# Patient Record
Sex: Female | Born: 1990 | Race: White | Hispanic: No | Marital: Single | Smoking: Never smoker
Health system: Southern US, Community
[De-identification: ages and names within clinical notes are randomized; demographics above are authoritative.]

## PROBLEM LIST (undated history)

## (undated) DIAGNOSIS — F329 Major depressive disorder, single episode, unspecified: Secondary | ICD-10-CM

## (undated) DIAGNOSIS — F32A Depression, unspecified: Secondary | ICD-10-CM

---

## 2013-01-06 ENCOUNTER — Emergency Department (HOSPITAL_COMMUNITY)
Admission: EM | Admit: 2013-01-06 | Discharge: 2013-01-06 | Disposition: A | Discharge status: Home / Self Care | Payer: BC Managed Care – PPO | Attending: Emergency Medicine | Admitting: Emergency Medicine

## 2013-01-06 ENCOUNTER — Encounter (HOSPITAL_COMMUNITY): Payer: Self-pay | Admitting: Emergency Medicine

## 2013-01-06 ENCOUNTER — Emergency Department (HOSPITAL_COMMUNITY): Payer: BC Managed Care – PPO

## 2013-01-06 DIAGNOSIS — Z79899 Other long term (current) drug therapy: Secondary | ICD-10-CM | POA: Insufficient documentation

## 2013-01-06 DIAGNOSIS — S0990XA Unspecified injury of head, initial encounter: Secondary | ICD-10-CM

## 2013-01-06 DIAGNOSIS — W1809XA Striking against other object with subsequent fall, initial encounter: Secondary | ICD-10-CM | POA: Insufficient documentation

## 2013-01-06 DIAGNOSIS — F3289 Other specified depressive episodes: Secondary | ICD-10-CM | POA: Insufficient documentation

## 2013-01-06 DIAGNOSIS — F329 Major depressive disorder, single episode, unspecified: Secondary | ICD-10-CM | POA: Insufficient documentation

## 2013-01-06 DIAGNOSIS — R110 Nausea: Secondary | ICD-10-CM | POA: Insufficient documentation

## 2013-01-06 DIAGNOSIS — Y9341 Activity, dancing: Secondary | ICD-10-CM | POA: Insufficient documentation

## 2013-01-06 DIAGNOSIS — Y9289 Other specified places as the place of occurrence of the external cause: Secondary | ICD-10-CM | POA: Insufficient documentation

## 2013-01-06 HISTORY — DX: Major depressive disorder, single episode, unspecified: F32.9

## 2013-01-06 HISTORY — DX: Depression, unspecified: F32.A

## 2013-01-06 NOTE — ED Notes (Signed)
Pt presenting to ed with c/o falling while in dance class and hitting the back of her head. Pt states she hit the back of her head and denies loc. Pt states intermittent nausea no vomiting. Pt states onset this am at 10:15

## 2013-01-06 NOTE — ED Provider Notes (Signed)
History     CSN: [id number]  Arrival date & time 01/06/13  1347   First MD Initiated Contact with Patient 01/06/13 1352      Chief Complaint  Patient presents with  . Fall  . Headache    (Consider location/radiation/quality/duration/timing/severity/associated sxs/prior treatment) HPI Comments: Patient presents emergency department with chief complaint of headache. Patient states that she was in dance class today, and was doing "floor work," when one of the maneuvers required that she roll onto her back. When she was doing this, she hit her head against the floor. She states that this happened twice. States after dye was class she had a headache which has progressively worsened. She also states that she has felt mildly nauseated. She denies any vomiting. She denies any visual deficits. Denies any weakness, numbness or tingling of the arms or legs. She denies any other symptoms except for the headache and mild nausea.  The history is provided by the patient. No language interpreter was used.    Past Medical History  Diagnosis Date  . Depression     History reviewed. No pertinent past surgical history.  No family history on file.  History  Substance Use Topics  . Smoking status: Never Smoker   . Smokeless tobacco: Not on file  . Alcohol Use: No    OB History   Grav Para Term Preterm Abortions TAB SAB Ect Mult Living                  Review of Systems  All other systems reviewed and are negative.    Allergies  Review of patient's allergies indicates no known allergies.  Home Medications   Current Outpatient Rx  Name  Route  Sig  Dispense  Refill  . escitalopram (LEXAPRO) 20 MG tablet   Oral   Take 20 mg by mouth daily.         . Multiple Vitamin (MULTIVITAMIN WITH MINERALS) TABS   Oral   Take 1 tablet by mouth daily.           BP 136/58  Pulse 81  Temp(Src) 99 F (37.2 C) (Oral)  Resp 18  SpO2 100%  LMP 12/23/2012  Physical Exam  Nursing note  and vitals reviewed. Constitutional: She is oriented to person, place, and time. She appears well-developed and well-nourished.  HENT:  Head: Normocephalic and atraumatic.  Right Ear: External ear normal.  Left Ear: External ear normal.  Non-tender over temporal artery, no increased pain with chewing.  Eyes: Conjunctivae and EOM are normal. Pupils are equal, round, and reactive to light.  No papilledema  Neck: Normal range of motion. Neck supple.  No pain with neck flexion, no meningismus  Cardiovascular: Normal rate, regular rhythm and normal heart sounds.  Exam reveals no gallop and no friction rub.   No murmur heard. Pulmonary/Chest: Effort normal and breath sounds normal. No respiratory distress. She has no wheezes. She has no rales. She exhibits no tenderness.  Abdominal: Soft. Bowel sounds are normal. She exhibits no distension and no mass. There is no tenderness. There is no rebound and no guarding.  Musculoskeletal: Normal range of motion. She exhibits no edema and no tenderness.  Normal gait.  Neurological: She is alert and oriented to person, place, and time.  CN 3-12 intact, no pronator drift, normal shin to heel, normal RAM, sensation and strength intact bilaterally.  Skin: Skin is warm and dry.  Psychiatric: She has a normal mood and affect. Her behavior is normal.  Judgment and thought content normal.    ED Course  Procedures (including critical care time)  No results found for this or any previous visit. Ct Head Wo Contrast  01/06/2013  *RADIOLOGY REPORT*  Clinical Data: Larey Seat hitting head, headache  CT HEAD WITHOUT CONTRAST  Technique:  Contiguous axial images were obtained from the base of the skull through the vertex without contrast.  Comparison: None.  Findings: The ventricular system is normal in size and configuration, and the septum is in a normal midline position.  The fourth ventricle and basilar cisterns appear normal.  No hemorrhage, mass lesion, or acute  infarction is seen.  On bone window images, no calvarial abnormality is noted.  No scalp hematoma is evident.  The paranasal sinuses are pneumatized.  IMPRESSION: Negative unenhanced CT of the brain.   Original Report Authenticated By: Dwyane Dee, M.D.       1. Head injury, initial encounter       MDM  Patient with headache following hitting her head against the dance floor. Believe this to be low impact injury. She currently clears [place] head CT rules. Plan is to discharge with return precautions.  Patient states that she feels increasing nausea. I will order a head CT.  The head CT is negative, will discharge the patient to home with instructions to rest.        Roxy Horseman, PA-C 01/06/13 1521

## 2013-01-08 NOTE — ED Provider Notes (Signed)
Medical screening examination/treatment/procedure(s) were performed by non-physician practitioner and as supervising physician I was immediately available for consultation/collaboration.   Kristl Morioka, MD 01/08/13 0027 

## 2014-04-09 IMAGING — CT CT HEAD W/O CM
2 series · 17 of 30 positions shown, 20 images · non-contrast
Comparison: None.

CLINICAL DATA: Fell hitting head, headache

CT HEAD WITHOUT CONTRAST
TECHNIQUE: Contiguous axial images were obtained from the base of
the skull through the vertex without contrast.

[Series 2: head w/o · axial · non-contrast · 0.43mm/px · z∈[-308,-188]mm · 9 of 30 slices shown, 12 images]
[im 3/30  brain]
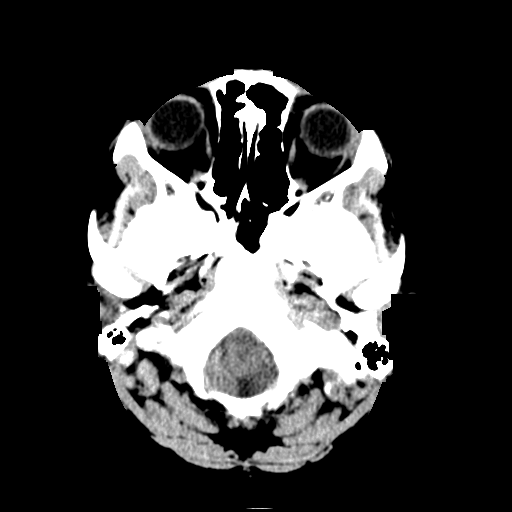
[im 3/30  bone]
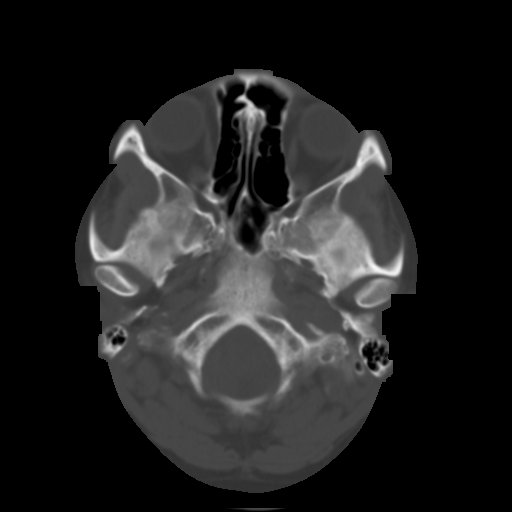
[im 6/30  brain]
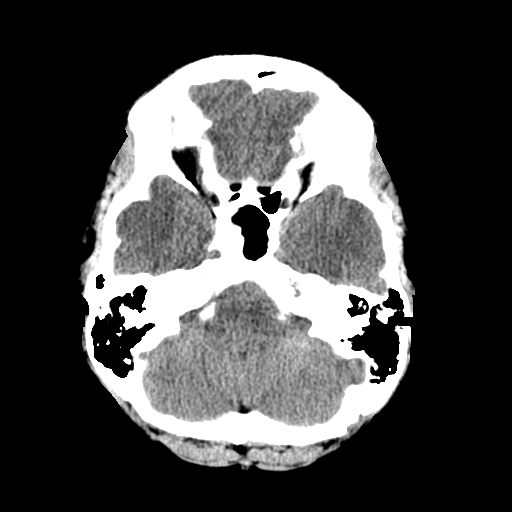
[im 9/30  brain]
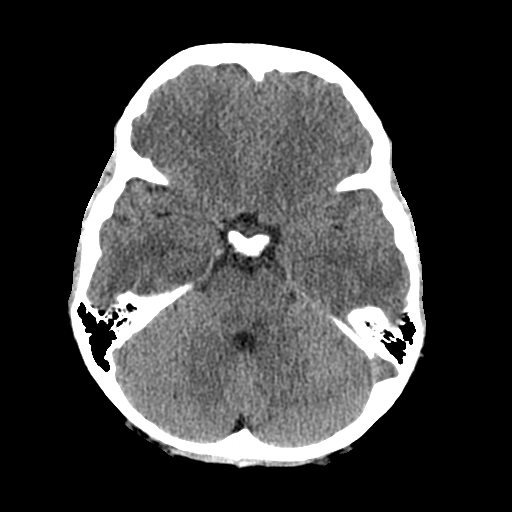
[im 12/30  brain]
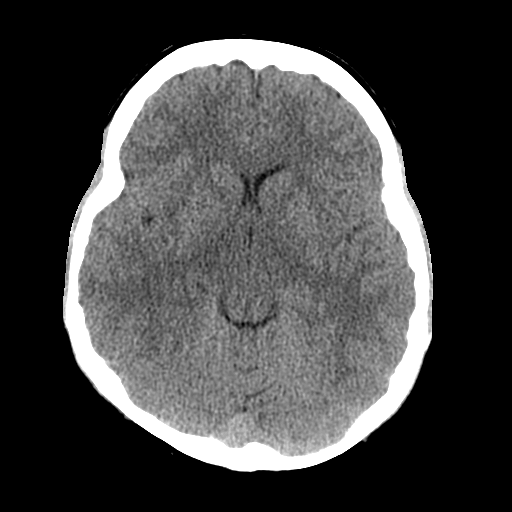
[im 15/30  brain]
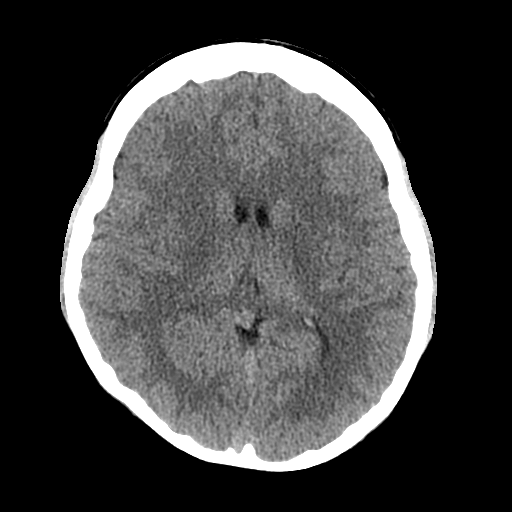
[im 15/30  bone]
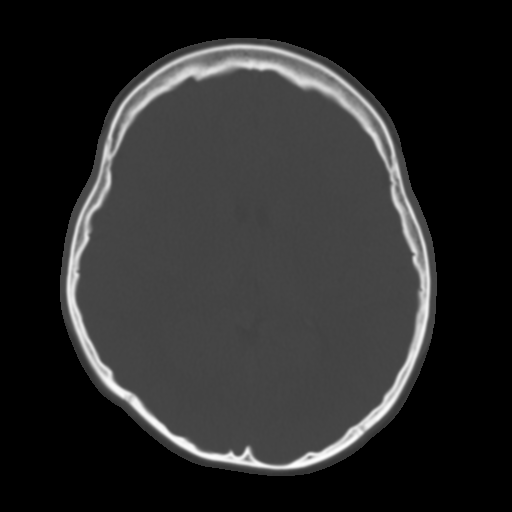
[im 18/30  brain]
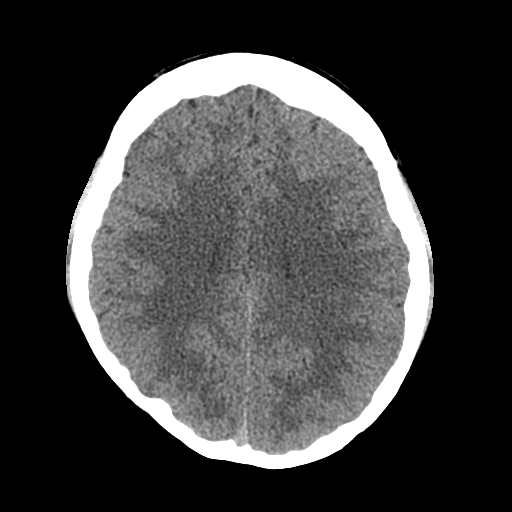
[im 21/30  brain]
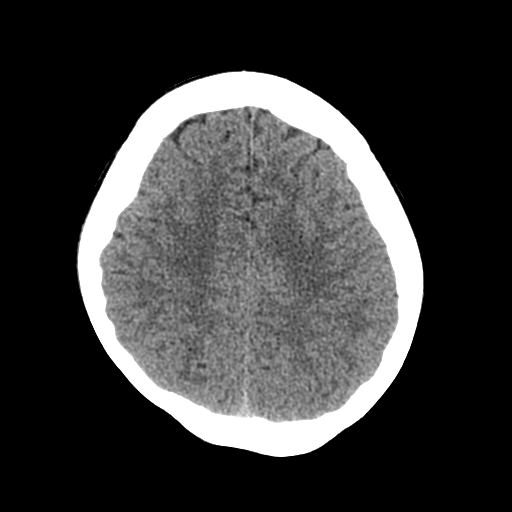
[im 24/30  brain]
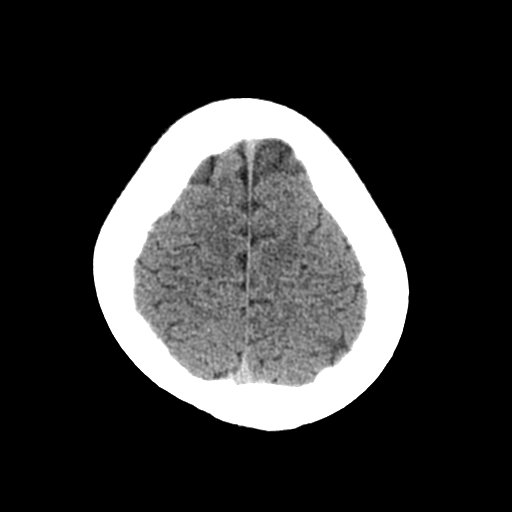
[im 27/30  brain]
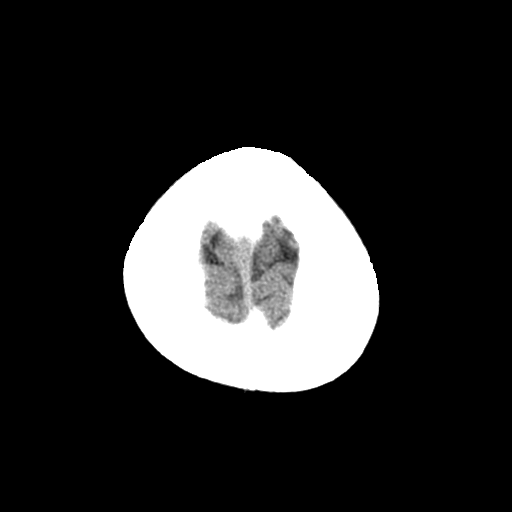
[im 27/30  bone]
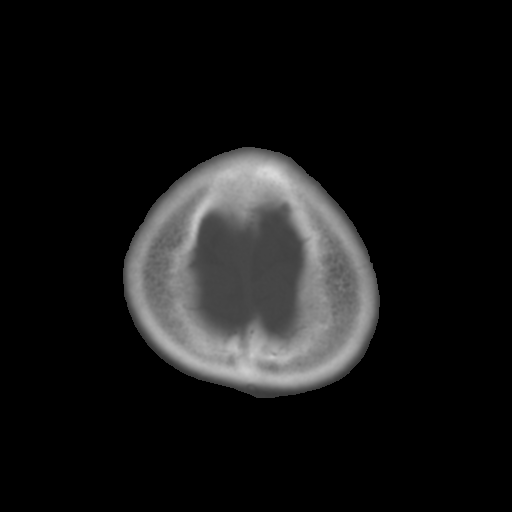

[Series 3: bone windows · axial · 0.43mm/px · z∈[-303,-189]mm · 8 of 50 slices shown]
[im 6/50  bone]
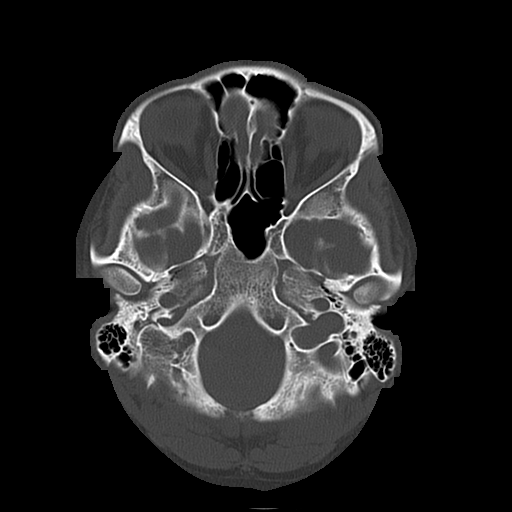
[im 11/50  bone]
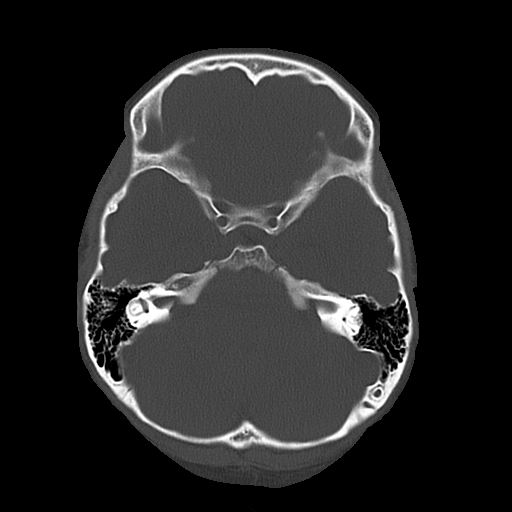
[im 17/50  bone]
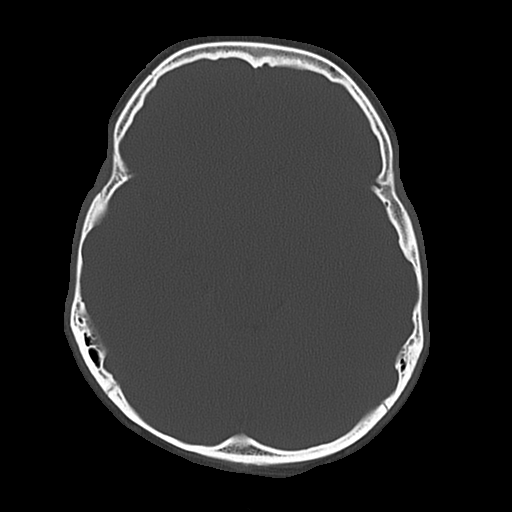
[im 22/50  bone]
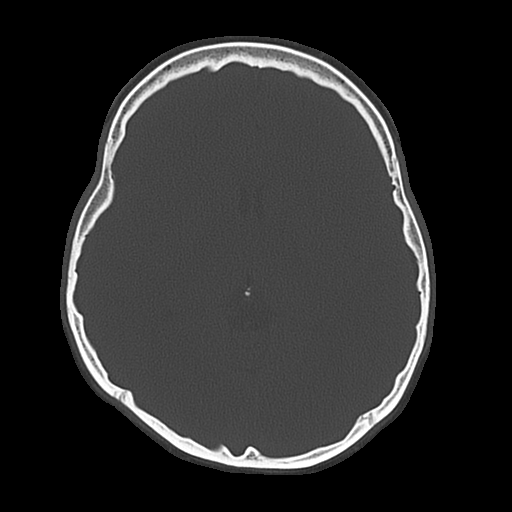
[im 28/50  bone]
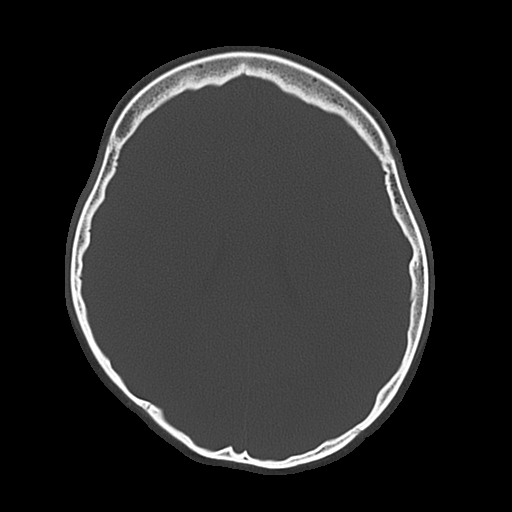
[im 33/50  bone]
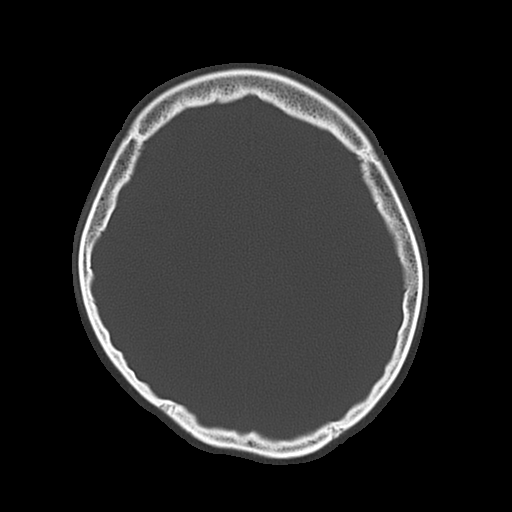
[im 39/50  bone]
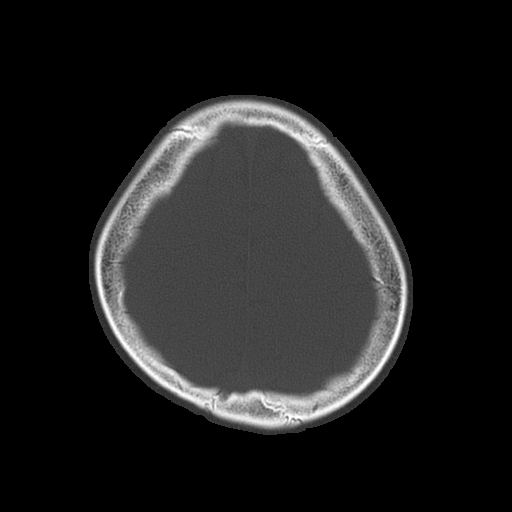
[im 44/50  bone]
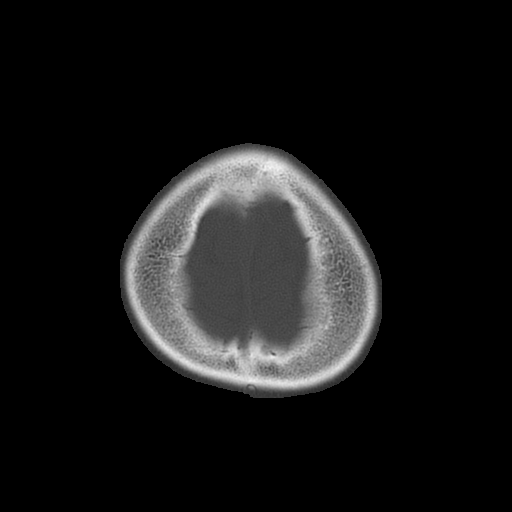

[17 of 30 positions shown; findings below may reference images not displayed]

FINDINGS: The ventricular system is normal in size and
configuration, and the septum is in a normal midline position.  The
fourth ventricle and basilar cisterns appear normal.  No
hemorrhage, mass lesion, or acute infarction is seen.  On bone
window images, no calvarial abnormality is noted.  No scalp
hematoma is evident.  The paranasal sinuses are pneumatized.
IMPRESSION: Negative unenhanced CT of the brain.

## 2014-09-03 ENCOUNTER — Encounter (HOSPITAL_COMMUNITY): Payer: Self-pay

## 2014-09-03 ENCOUNTER — Emergency Department (HOSPITAL_COMMUNITY)
Admission: EM | Admit: 2014-09-03 | Discharge: 2014-09-03 | Disposition: A | Discharge status: Home / Self Care | Payer: BC Managed Care – PPO | Source: Home / Self Care | Attending: Emergency Medicine | Admitting: Emergency Medicine

## 2014-09-03 DIAGNOSIS — J050 Acute obstructive laryngitis [croup]: Secondary | ICD-10-CM

## 2014-09-03 DIAGNOSIS — B9789 Other viral agents as the cause of diseases classified elsewhere: Secondary | ICD-10-CM

## 2014-09-03 MED ORDER — PREDNISONE 20 MG PO TABS: 40.0000 mg | ORAL_TABLET | Freq: Every day | ORAL | Status: DC

## 2014-09-03 MED ORDER — HYDROCODONE-HOMATROPINE 5-1.5 MG/5ML PO SYRP: 5.0000 mL | ORAL_SOLUTION | Freq: Four times a day (QID) | ORAL | Status: DC | PRN

## 2014-09-03 NOTE — ED Notes (Signed)
C/o cough x couple of days, some improvement, but is starting to get worse . Minimal relief w OTC medications. Barking dry cough

## 2014-09-03 NOTE — Discharge Instructions (Signed)
You have a new viral infection causing the cough. Take prednisone 40mg  daily for 5 days. Use the cough syrup as needed.  This has a narcotic in it so do not take it if you are going to be driving.  If you develop fevers or trouble breathing, please come back.

## 2014-09-03 NOTE — ED Provider Notes (Signed)
CSN: [id number]     Arrival date & time 09/03/14  1008 History   First MD Initiated Contact with Patient 09/03/14 1037     Chief Complaint  Patient presents with  . Cough   (Consider location/radiation/quality/duration/timing/severity/associated sxs/prior Treatment) HPI She is a 23 year old woman here for evaluation of cough. She states that 3 weeks ago she was diagnosed with bronchitis and treated with antibiotics. She finished antibiotics 2 weeks ago, and states her cough had resolved at that time. Then, 3-4 days ago, she developed another cough. It is nonproductive. She reports feeling congestion in her chest. She has a mild runny nose. She had a low-grade temp on Thursday of 99.9. No nausea or vomiting. No sore throat or headache. No shortness of breath.  Past Medical History  Diagnosis Date  . Depression    History reviewed. No pertinent past surgical history. History reviewed. No pertinent family history. History  Substance Use Topics  . Smoking status: Never Smoker   . Smokeless tobacco: Not on file  . Alcohol Use: No   OB History    No data available     Review of Systems  Constitutional: Negative for fever and chills.  HENT: Positive for rhinorrhea. Negative for congestion, sore throat and trouble swallowing.   Respiratory: Positive for cough. Negative for chest tightness and shortness of breath.   Gastrointestinal: Negative for nausea and vomiting.  Neurological: Negative for headaches.    Allergies  Review of patient's allergies indicates no known allergies.  Home Medications   Prior to Admission medications   Medication Sig Start Date End Date Taking? Authorizing Provider  escitalopram (LEXAPRO) 20 MG tablet Take 20 mg by mouth daily.    Historical Provider, MD  HYDROcodone-homatropine (HYCODAN) 5-1.5 MG/5ML syrup Take 5 mLs by mouth every 6 (six) hours as needed for cough. 09/03/14   Charm RingsErin J Honig, MD  Multiple Vitamin (MULTIVITAMIN WITH MINERALS) TABS Take 1  tablet by mouth daily.    Historical Provider, MD  predniSONE (DELTASONE) 20 MG tablet Take 2 tablets (40 mg total) by mouth daily. 09/03/14   Charm RingsErin J Honig, MD   BP 137/75 mmHg  Pulse 101  Temp(Src) 98.9 F (37.2 C) (Oral)  Resp 18  SpO2 97% Physical Exam  Constitutional: She is oriented to person, place, and time. She appears well-developed and well-nourished.  HENT:  Nose: Nose normal.  Mouth/Throat: Oropharynx is clear and moist. No oropharyngeal exudate.  Neck: Neck supple.  Cardiovascular: Normal rate, regular rhythm and normal heart sounds.   No murmur heard. Pulmonary/Chest: Effort normal and breath sounds normal. No respiratory distress. She has no wheezes. She has no rales.  Lymphadenopathy:    She has no cervical adenopathy.  Neurological: She is alert and oriented to person, place, and time.    ED Course  Procedures (including critical care time) Labs Review Labs Reviewed - No data to display  Imaging Review No results found.   MDM   1. Viral croup    She has a croup sounding cough. We'll treat with prednisone for 5 days. Given cough syrup provided to use as needed. Discussed that this contains a narcotic medication. Follow-up as needed.    Charm RingsErin J Honig, MD 09/03/14 [phone]

## 2017-02-19 ENCOUNTER — Ambulatory Visit (INDEPENDENT_AMBULATORY_CARE_PROVIDER_SITE_OTHER): Payer: Managed Care, Other (non HMO) | Admitting: Clinical

## 2017-02-19 ENCOUNTER — Encounter (HOSPITAL_COMMUNITY): Payer: Self-pay | Admitting: Clinical

## 2017-02-19 DIAGNOSIS — F3162 Bipolar disorder, current episode mixed, moderate: Secondary | ICD-10-CM

## 2017-02-19 DIAGNOSIS — F4310 Post-traumatic stress disorder, unspecified: Secondary | ICD-10-CM | POA: Diagnosis not present
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
## 2017-03-05 ENCOUNTER — Ambulatory Visit (INDEPENDENT_AMBULATORY_CARE_PROVIDER_SITE_OTHER): Payer: Managed Care, Other (non HMO) | Admitting: Clinical

## 2017-03-05 ENCOUNTER — Encounter (HOSPITAL_COMMUNITY): Payer: Self-pay | Admitting: Clinical

## 2017-03-05 DIAGNOSIS — F3162 Bipolar disorder, current episode mixed, moderate: Secondary | ICD-10-CM

## 2017-03-05 DIAGNOSIS — F4310 Post-traumatic stress disorder, unspecified: Secondary | ICD-10-CM

## 2017-03-05 NOTE — Progress Notes (Signed)
   THERAPIST PROGRESS NOTE  Session Time: 9:00 -9:55   Participation Level: Active  Behavioral Response: CasualAlertDepressed  Type of Therapy: Individual Therapy  Treatment Goals addressed: Improve psychiatric symptoms, elevate mood, improve unhelpful thought patterns, emotional regulation skills,healthy coping skills  Interventions: Motivational interviewing, CBT,   Summary: Barbara Freeman is a 26 y.o. female who presents with Bipolar I Disorder, mixed,  moderate.   Suicidal/Homicidal: Nowithout intent/plan  Therapist Response: Barbara Freeman met with clinician for an individual therapy session. She discussed her psychiatric symptoms, her current life event and her goals for therapy. Barbara Freeman shared that she has been really struggling with her depression and is "mad at the world" Clinician asked open ended questions and Barbara Freeman shared about her psychiatric symptoms and her negative thoughts. Clinician asked open ended questions and Barbara Freeman shared about some events that she has been ruminating about. Client and clinician discussed cbt and the thought emotion connection. Clinician asked open ended questions and Barbara Freeman identified her negative thoughts and reaction to those thoughts. She then identified the evidence for and against the thoughts. She was then able to formulate healthier alternative thoughts which are more in alignment with her personal goal of healthy relationships.Clinician gave her bipolar packet 1 &2 and some ABC CBT worksheets. Clinician informed Barbara Freeman she would be leaving the practice. Clinician asked open ended questions and Barbara Freeman shared her thoughts and emotions about clinician leaving. Client and clinician discussed options for her to continue therapy .   Plan: Return again in 1-2 weeks.  Diagnosis: Axis I: Bipolar I Disorder, mixed,  moderate.       Shyteria Lewis A, LCSW 03/05/2017

## 2017-06-24 ENCOUNTER — Ambulatory Visit: Payer: Managed Care, Other (non HMO) | Admitting: Neurology

## 2018-09-30 DIAGNOSIS — F4310 Post-traumatic stress disorder, unspecified: Secondary | ICD-10-CM | POA: Diagnosis not present

## 2018-10-01 DIAGNOSIS — M79671 Pain in right foot: Secondary | ICD-10-CM | POA: Diagnosis not present

## 2018-10-01 DIAGNOSIS — S93601A Unspecified sprain of right foot, initial encounter: Secondary | ICD-10-CM | POA: Diagnosis not present

## 2018-10-01 DIAGNOSIS — W108XXA Fall (on) (from) other stairs and steps, initial encounter: Secondary | ICD-10-CM | POA: Diagnosis not present

## 2018-10-05 DIAGNOSIS — F4310 Post-traumatic stress disorder, unspecified: Secondary | ICD-10-CM | POA: Diagnosis not present

## 2018-10-07 DIAGNOSIS — M25551 Pain in right hip: Secondary | ICD-10-CM | POA: Diagnosis not present

## 2018-10-12 DIAGNOSIS — F4320 Adjustment disorder, unspecified: Secondary | ICD-10-CM | POA: Diagnosis not present

## 2018-10-14 DIAGNOSIS — F330 Major depressive disorder, recurrent, mild: Secondary | ICD-10-CM | POA: Diagnosis not present

## 2018-10-14 DIAGNOSIS — F603 Borderline personality disorder: Secondary | ICD-10-CM | POA: Diagnosis not present

## 2018-10-14 DIAGNOSIS — F411 Generalized anxiety disorder: Secondary | ICD-10-CM | POA: Diagnosis not present

## 2018-10-19 DIAGNOSIS — F3341 Major depressive disorder, recurrent, in partial remission: Secondary | ICD-10-CM | POA: Diagnosis not present

## 2018-10-19 DIAGNOSIS — M25651 Stiffness of right hip, not elsewhere classified: Secondary | ICD-10-CM | POA: Diagnosis not present

## 2018-10-19 DIAGNOSIS — F3181 Bipolar II disorder: Secondary | ICD-10-CM | POA: Diagnosis not present

## 2018-10-19 DIAGNOSIS — F3281 Premenstrual dysphoric disorder: Secondary | ICD-10-CM | POA: Diagnosis not present

## 2018-10-19 DIAGNOSIS — M6289 Other specified disorders of muscle: Secondary | ICD-10-CM | POA: Diagnosis not present

## 2018-10-19 DIAGNOSIS — F4310 Post-traumatic stress disorder, unspecified: Secondary | ICD-10-CM | POA: Diagnosis not present

## 2018-10-19 DIAGNOSIS — M62830 Muscle spasm of back: Secondary | ICD-10-CM | POA: Diagnosis not present

## 2018-10-19 DIAGNOSIS — M62838 Other muscle spasm: Secondary | ICD-10-CM | POA: Diagnosis not present

## 2018-10-21 DIAGNOSIS — R42 Dizziness and giddiness: Secondary | ICD-10-CM | POA: Diagnosis not present

## 2018-10-21 DIAGNOSIS — D508 Other iron deficiency anemias: Secondary | ICD-10-CM | POA: Diagnosis not present

## 2018-10-21 DIAGNOSIS — F4310 Post-traumatic stress disorder, unspecified: Secondary | ICD-10-CM | POA: Diagnosis not present

## 2018-10-21 DIAGNOSIS — N925 Other specified irregular menstruation: Secondary | ICD-10-CM | POA: Diagnosis not present

## 2018-10-26 DIAGNOSIS — F4310 Post-traumatic stress disorder, unspecified: Secondary | ICD-10-CM | POA: Diagnosis not present

## 2018-10-28 DIAGNOSIS — M6289 Other specified disorders of muscle: Secondary | ICD-10-CM | POA: Diagnosis not present

## 2018-10-28 DIAGNOSIS — M62830 Muscle spasm of back: Secondary | ICD-10-CM | POA: Diagnosis not present

## 2018-10-28 DIAGNOSIS — M62838 Other muscle spasm: Secondary | ICD-10-CM | POA: Diagnosis not present

## 2018-10-28 DIAGNOSIS — M25651 Stiffness of right hip, not elsewhere classified: Secondary | ICD-10-CM | POA: Diagnosis not present

## 2018-11-02 DIAGNOSIS — M62830 Muscle spasm of back: Secondary | ICD-10-CM | POA: Diagnosis not present

## 2018-11-02 DIAGNOSIS — M25651 Stiffness of right hip, not elsewhere classified: Secondary | ICD-10-CM | POA: Diagnosis not present

## 2018-11-02 DIAGNOSIS — M62838 Other muscle spasm: Secondary | ICD-10-CM | POA: Diagnosis not present

## 2018-11-02 DIAGNOSIS — M6289 Other specified disorders of muscle: Secondary | ICD-10-CM | POA: Diagnosis not present

## 2018-11-03 DIAGNOSIS — F4310 Post-traumatic stress disorder, unspecified: Secondary | ICD-10-CM | POA: Diagnosis not present

## 2018-11-09 DIAGNOSIS — M62830 Muscle spasm of back: Secondary | ICD-10-CM | POA: Diagnosis not present

## 2018-11-09 DIAGNOSIS — M62838 Other muscle spasm: Secondary | ICD-10-CM | POA: Diagnosis not present

## 2018-11-09 DIAGNOSIS — M25651 Stiffness of right hip, not elsewhere classified: Secondary | ICD-10-CM | POA: Diagnosis not present

## 2018-11-09 DIAGNOSIS — M6289 Other specified disorders of muscle: Secondary | ICD-10-CM | POA: Diagnosis not present

## 2018-11-11 DIAGNOSIS — F4310 Post-traumatic stress disorder, unspecified: Secondary | ICD-10-CM | POA: Diagnosis not present

## 2018-11-16 DIAGNOSIS — M62838 Other muscle spasm: Secondary | ICD-10-CM | POA: Diagnosis not present

## 2018-11-16 DIAGNOSIS — M25651 Stiffness of right hip, not elsewhere classified: Secondary | ICD-10-CM | POA: Diagnosis not present

## 2018-11-16 DIAGNOSIS — M6289 Other specified disorders of muscle: Secondary | ICD-10-CM | POA: Diagnosis not present

## 2018-11-16 DIAGNOSIS — M62830 Muscle spasm of back: Secondary | ICD-10-CM | POA: Diagnosis not present

## 2018-11-17 DIAGNOSIS — F4310 Post-traumatic stress disorder, unspecified: Secondary | ICD-10-CM | POA: Diagnosis not present

## 2018-11-22 DIAGNOSIS — H6122 Impacted cerumen, left ear: Secondary | ICD-10-CM | POA: Diagnosis not present

## 2018-11-23 DIAGNOSIS — M6289 Other specified disorders of muscle: Secondary | ICD-10-CM | POA: Diagnosis not present

## 2018-11-23 DIAGNOSIS — M62838 Other muscle spasm: Secondary | ICD-10-CM | POA: Diagnosis not present

## 2018-11-23 DIAGNOSIS — M25651 Stiffness of right hip, not elsewhere classified: Secondary | ICD-10-CM | POA: Diagnosis not present

## 2018-11-23 DIAGNOSIS — M62830 Muscle spasm of back: Secondary | ICD-10-CM | POA: Diagnosis not present

## 2018-11-25 DIAGNOSIS — F4310 Post-traumatic stress disorder, unspecified: Secondary | ICD-10-CM | POA: Diagnosis not present

## 2018-11-30 DIAGNOSIS — M25651 Stiffness of right hip, not elsewhere classified: Secondary | ICD-10-CM | POA: Diagnosis not present

## 2018-11-30 DIAGNOSIS — M6289 Other specified disorders of muscle: Secondary | ICD-10-CM | POA: Diagnosis not present

## 2018-11-30 DIAGNOSIS — M62830 Muscle spasm of back: Secondary | ICD-10-CM | POA: Diagnosis not present

## 2018-11-30 DIAGNOSIS — M62838 Other muscle spasm: Secondary | ICD-10-CM | POA: Diagnosis not present

## 2018-12-02 DIAGNOSIS — F3281 Premenstrual dysphoric disorder: Secondary | ICD-10-CM | POA: Diagnosis not present

## 2018-12-02 DIAGNOSIS — F3341 Major depressive disorder, recurrent, in partial remission: Secondary | ICD-10-CM | POA: Diagnosis not present

## 2018-12-02 DIAGNOSIS — F4310 Post-traumatic stress disorder, unspecified: Secondary | ICD-10-CM | POA: Diagnosis not present

## 2018-12-03 DIAGNOSIS — F4310 Post-traumatic stress disorder, unspecified: Secondary | ICD-10-CM | POA: Diagnosis not present

## 2018-12-07 DIAGNOSIS — M62838 Other muscle spasm: Secondary | ICD-10-CM | POA: Diagnosis not present

## 2018-12-07 DIAGNOSIS — M6289 Other specified disorders of muscle: Secondary | ICD-10-CM | POA: Diagnosis not present

## 2018-12-07 DIAGNOSIS — M25651 Stiffness of right hip, not elsewhere classified: Secondary | ICD-10-CM | POA: Diagnosis not present

## 2018-12-07 DIAGNOSIS — M62830 Muscle spasm of back: Secondary | ICD-10-CM | POA: Diagnosis not present

## 2018-12-13 DIAGNOSIS — F332 Major depressive disorder, recurrent severe without psychotic features: Secondary | ICD-10-CM | POA: Diagnosis not present

## 2018-12-14 DIAGNOSIS — M25651 Stiffness of right hip, not elsewhere classified: Secondary | ICD-10-CM | POA: Diagnosis not present

## 2018-12-14 DIAGNOSIS — M62838 Other muscle spasm: Secondary | ICD-10-CM | POA: Diagnosis not present

## 2018-12-14 DIAGNOSIS — M6289 Other specified disorders of muscle: Secondary | ICD-10-CM | POA: Diagnosis not present

## 2018-12-14 DIAGNOSIS — M62830 Muscle spasm of back: Secondary | ICD-10-CM | POA: Diagnosis not present

## 2018-12-15 DIAGNOSIS — F4310 Post-traumatic stress disorder, unspecified: Secondary | ICD-10-CM | POA: Diagnosis not present

## 2018-12-20 DIAGNOSIS — F3181 Bipolar II disorder: Secondary | ICD-10-CM | POA: Diagnosis not present

## 2018-12-21 DIAGNOSIS — M6289 Other specified disorders of muscle: Secondary | ICD-10-CM | POA: Diagnosis not present

## 2018-12-21 DIAGNOSIS — M62838 Other muscle spasm: Secondary | ICD-10-CM | POA: Diagnosis not present

## 2018-12-21 DIAGNOSIS — M25651 Stiffness of right hip, not elsewhere classified: Secondary | ICD-10-CM | POA: Diagnosis not present

## 2018-12-21 DIAGNOSIS — M62830 Muscle spasm of back: Secondary | ICD-10-CM | POA: Diagnosis not present

## 2018-12-25 DIAGNOSIS — F4310 Post-traumatic stress disorder, unspecified: Secondary | ICD-10-CM | POA: Diagnosis not present

## 2018-12-27 DIAGNOSIS — F4310 Post-traumatic stress disorder, unspecified: Secondary | ICD-10-CM | POA: Diagnosis not present

## 2018-12-30 DIAGNOSIS — F411 Generalized anxiety disorder: Secondary | ICD-10-CM | POA: Diagnosis not present

## 2019-01-03 DIAGNOSIS — F4310 Post-traumatic stress disorder, unspecified: Secondary | ICD-10-CM | POA: Diagnosis not present

## 2019-01-04 DIAGNOSIS — M62838 Other muscle spasm: Secondary | ICD-10-CM | POA: Diagnosis not present

## 2019-01-04 DIAGNOSIS — M62830 Muscle spasm of back: Secondary | ICD-10-CM | POA: Diagnosis not present

## 2019-01-04 DIAGNOSIS — M25651 Stiffness of right hip, not elsewhere classified: Secondary | ICD-10-CM | POA: Diagnosis not present

## 2019-01-04 DIAGNOSIS — M6289 Other specified disorders of muscle: Secondary | ICD-10-CM | POA: Diagnosis not present

## 2019-01-05 DIAGNOSIS — F4310 Post-traumatic stress disorder, unspecified: Secondary | ICD-10-CM | POA: Diagnosis not present

## 2019-01-05 DIAGNOSIS — F3341 Major depressive disorder, recurrent, in partial remission: Secondary | ICD-10-CM | POA: Diagnosis not present

## 2019-01-05 DIAGNOSIS — F3281 Premenstrual dysphoric disorder: Secondary | ICD-10-CM | POA: Diagnosis not present

## 2019-01-10 DIAGNOSIS — F332 Major depressive disorder, recurrent severe without psychotic features: Secondary | ICD-10-CM | POA: Diagnosis not present

## 2019-01-11 DIAGNOSIS — M25651 Stiffness of right hip, not elsewhere classified: Secondary | ICD-10-CM | POA: Diagnosis not present

## 2019-01-11 DIAGNOSIS — M6289 Other specified disorders of muscle: Secondary | ICD-10-CM | POA: Diagnosis not present

## 2019-01-11 DIAGNOSIS — M62838 Other muscle spasm: Secondary | ICD-10-CM | POA: Diagnosis not present

## 2019-01-11 DIAGNOSIS — M62830 Muscle spasm of back: Secondary | ICD-10-CM | POA: Diagnosis not present

## 2019-01-12 DIAGNOSIS — F4310 Post-traumatic stress disorder, unspecified: Secondary | ICD-10-CM | POA: Diagnosis not present

## 2019-01-17 DIAGNOSIS — F332 Major depressive disorder, recurrent severe without psychotic features: Secondary | ICD-10-CM | POA: Diagnosis not present

## 2019-01-18 DIAGNOSIS — M25651 Stiffness of right hip, not elsewhere classified: Secondary | ICD-10-CM | POA: Diagnosis not present

## 2019-01-18 DIAGNOSIS — M62830 Muscle spasm of back: Secondary | ICD-10-CM | POA: Diagnosis not present

## 2019-01-18 DIAGNOSIS — M6289 Other specified disorders of muscle: Secondary | ICD-10-CM | POA: Diagnosis not present

## 2019-01-18 DIAGNOSIS — M62838 Other muscle spasm: Secondary | ICD-10-CM | POA: Diagnosis not present

## 2019-01-22 DIAGNOSIS — F332 Major depressive disorder, recurrent severe without psychotic features: Secondary | ICD-10-CM | POA: Diagnosis not present

## 2019-01-24 DIAGNOSIS — F332 Major depressive disorder, recurrent severe without psychotic features: Secondary | ICD-10-CM | POA: Diagnosis not present

## 2019-01-25 DIAGNOSIS — M62830 Muscle spasm of back: Secondary | ICD-10-CM | POA: Diagnosis not present

## 2019-01-25 DIAGNOSIS — M25651 Stiffness of right hip, not elsewhere classified: Secondary | ICD-10-CM | POA: Diagnosis not present

## 2019-01-25 DIAGNOSIS — M62838 Other muscle spasm: Secondary | ICD-10-CM | POA: Diagnosis not present

## 2019-01-25 DIAGNOSIS — M6289 Other specified disorders of muscle: Secondary | ICD-10-CM | POA: Diagnosis not present

## 2019-01-28 DIAGNOSIS — F332 Major depressive disorder, recurrent severe without psychotic features: Secondary | ICD-10-CM | POA: Diagnosis not present

## 2019-02-01 DIAGNOSIS — Z131 Encounter for screening for diabetes mellitus: Secondary | ICD-10-CM | POA: Diagnosis not present

## 2019-02-01 DIAGNOSIS — Z0000 Encounter for general adult medical examination without abnormal findings: Secondary | ICD-10-CM | POA: Diagnosis not present

## 2019-02-01 DIAGNOSIS — M25651 Stiffness of right hip, not elsewhere classified: Secondary | ICD-10-CM | POA: Diagnosis not present

## 2019-02-01 DIAGNOSIS — M62830 Muscle spasm of back: Secondary | ICD-10-CM | POA: Diagnosis not present

## 2019-02-01 DIAGNOSIS — Z13220 Encounter for screening for lipoid disorders: Secondary | ICD-10-CM | POA: Diagnosis not present

## 2019-02-01 DIAGNOSIS — F3170 Bipolar disorder, currently in remission, most recent episode unspecified: Secondary | ICD-10-CM | POA: Diagnosis not present

## 2019-02-01 DIAGNOSIS — F332 Major depressive disorder, recurrent severe without psychotic features: Secondary | ICD-10-CM | POA: Diagnosis not present

## 2019-02-01 DIAGNOSIS — M62838 Other muscle spasm: Secondary | ICD-10-CM | POA: Diagnosis not present

## 2019-02-01 DIAGNOSIS — D649 Anemia, unspecified: Secondary | ICD-10-CM | POA: Diagnosis not present

## 2019-02-01 DIAGNOSIS — M6289 Other specified disorders of muscle: Secondary | ICD-10-CM | POA: Diagnosis not present

## 2019-02-04 DIAGNOSIS — F332 Major depressive disorder, recurrent severe without psychotic features: Secondary | ICD-10-CM | POA: Diagnosis not present

## 2019-02-07 DIAGNOSIS — F332 Major depressive disorder, recurrent severe without psychotic features: Secondary | ICD-10-CM | POA: Diagnosis not present

## 2019-02-08 DIAGNOSIS — D500 Iron deficiency anemia secondary to blood loss (chronic): Secondary | ICD-10-CM | POA: Diagnosis not present

## 2019-02-08 DIAGNOSIS — M62830 Muscle spasm of back: Secondary | ICD-10-CM | POA: Diagnosis not present

## 2019-02-08 DIAGNOSIS — M62838 Other muscle spasm: Secondary | ICD-10-CM | POA: Diagnosis not present

## 2019-02-08 DIAGNOSIS — N920 Excessive and frequent menstruation with regular cycle: Secondary | ICD-10-CM | POA: Diagnosis not present

## 2019-02-08 DIAGNOSIS — M25651 Stiffness of right hip, not elsewhere classified: Secondary | ICD-10-CM | POA: Diagnosis not present

## 2019-02-08 DIAGNOSIS — M6289 Other specified disorders of muscle: Secondary | ICD-10-CM | POA: Diagnosis not present

## 2019-02-14 DIAGNOSIS — F4310 Post-traumatic stress disorder, unspecified: Secondary | ICD-10-CM | POA: Diagnosis not present

## 2019-02-15 DIAGNOSIS — M62830 Muscle spasm of back: Secondary | ICD-10-CM | POA: Diagnosis not present

## 2019-02-15 DIAGNOSIS — M62838 Other muscle spasm: Secondary | ICD-10-CM | POA: Diagnosis not present

## 2019-02-15 DIAGNOSIS — F4310 Post-traumatic stress disorder, unspecified: Secondary | ICD-10-CM | POA: Diagnosis not present

## 2019-02-15 DIAGNOSIS — M25651 Stiffness of right hip, not elsewhere classified: Secondary | ICD-10-CM | POA: Diagnosis not present

## 2019-02-15 DIAGNOSIS — M6289 Other specified disorders of muscle: Secondary | ICD-10-CM | POA: Diagnosis not present

## 2019-02-16 DIAGNOSIS — N920 Excessive and frequent menstruation with regular cycle: Secondary | ICD-10-CM | POA: Diagnosis not present

## 2019-02-16 DIAGNOSIS — D500 Iron deficiency anemia secondary to blood loss (chronic): Secondary | ICD-10-CM | POA: Diagnosis not present

## 2019-02-18 DIAGNOSIS — F332 Major depressive disorder, recurrent severe without psychotic features: Secondary | ICD-10-CM | POA: Diagnosis not present

## 2019-02-23 DIAGNOSIS — D500 Iron deficiency anemia secondary to blood loss (chronic): Secondary | ICD-10-CM | POA: Diagnosis not present

## 2019-02-23 DIAGNOSIS — F332 Major depressive disorder, recurrent severe without psychotic features: Secondary | ICD-10-CM | POA: Diagnosis not present

## 2019-02-28 DIAGNOSIS — M62838 Other muscle spasm: Secondary | ICD-10-CM | POA: Diagnosis not present

## 2019-02-28 DIAGNOSIS — M6289 Other specified disorders of muscle: Secondary | ICD-10-CM | POA: Diagnosis not present

## 2019-02-28 DIAGNOSIS — M62830 Muscle spasm of back: Secondary | ICD-10-CM | POA: Diagnosis not present

## 2019-02-28 DIAGNOSIS — M25651 Stiffness of right hip, not elsewhere classified: Secondary | ICD-10-CM | POA: Diagnosis not present

## 2019-03-02 DIAGNOSIS — F4310 Post-traumatic stress disorder, unspecified: Secondary | ICD-10-CM | POA: Diagnosis not present

## 2019-03-07 DIAGNOSIS — J45909 Unspecified asthma, uncomplicated: Secondary | ICD-10-CM | POA: Diagnosis not present

## 2019-03-09 DIAGNOSIS — F4310 Post-traumatic stress disorder, unspecified: Secondary | ICD-10-CM | POA: Diagnosis not present

## 2019-03-09 DIAGNOSIS — D509 Iron deficiency anemia, unspecified: Secondary | ICD-10-CM | POA: Diagnosis not present

## 2019-03-09 DIAGNOSIS — Z01419 Encounter for gynecological examination (general) (routine) without abnormal findings: Secondary | ICD-10-CM | POA: Diagnosis not present

## 2019-03-09 DIAGNOSIS — Z1389 Encounter for screening for other disorder: Secondary | ICD-10-CM | POA: Diagnosis not present

## 2019-03-09 DIAGNOSIS — Z6839 Body mass index (BMI) 39.0-39.9, adult: Secondary | ICD-10-CM | POA: Diagnosis not present

## 2019-03-14 DIAGNOSIS — M62830 Muscle spasm of back: Secondary | ICD-10-CM | POA: Diagnosis not present

## 2019-03-14 DIAGNOSIS — M25651 Stiffness of right hip, not elsewhere classified: Secondary | ICD-10-CM | POA: Diagnosis not present

## 2019-03-14 DIAGNOSIS — M62838 Other muscle spasm: Secondary | ICD-10-CM | POA: Diagnosis not present

## 2019-03-14 DIAGNOSIS — M6289 Other specified disorders of muscle: Secondary | ICD-10-CM | POA: Diagnosis not present

## 2019-03-17 DIAGNOSIS — F4310 Post-traumatic stress disorder, unspecified: Secondary | ICD-10-CM | POA: Diagnosis not present

## 2019-03-24 DIAGNOSIS — F332 Major depressive disorder, recurrent severe without psychotic features: Secondary | ICD-10-CM | POA: Diagnosis not present

## 2019-03-28 DIAGNOSIS — M25651 Stiffness of right hip, not elsewhere classified: Secondary | ICD-10-CM | POA: Diagnosis not present

## 2019-03-28 DIAGNOSIS — M62830 Muscle spasm of back: Secondary | ICD-10-CM | POA: Diagnosis not present

## 2019-03-28 DIAGNOSIS — M6289 Other specified disorders of muscle: Secondary | ICD-10-CM | POA: Diagnosis not present

## 2019-03-28 DIAGNOSIS — M62838 Other muscle spasm: Secondary | ICD-10-CM | POA: Diagnosis not present

## 2019-03-31 DIAGNOSIS — F4310 Post-traumatic stress disorder, unspecified: Secondary | ICD-10-CM | POA: Diagnosis not present

## 2019-04-04 DIAGNOSIS — D500 Iron deficiency anemia secondary to blood loss (chronic): Secondary | ICD-10-CM | POA: Diagnosis not present

## 2019-04-04 DIAGNOSIS — R5383 Other fatigue: Secondary | ICD-10-CM | POA: Diagnosis not present

## 2019-04-04 DIAGNOSIS — D649 Anemia, unspecified: Secondary | ICD-10-CM | POA: Diagnosis not present

## 2019-04-07 DIAGNOSIS — F3341 Major depressive disorder, recurrent, in partial remission: Secondary | ICD-10-CM | POA: Diagnosis not present

## 2019-04-07 DIAGNOSIS — F4310 Post-traumatic stress disorder, unspecified: Secondary | ICD-10-CM | POA: Diagnosis not present

## 2019-04-07 DIAGNOSIS — F3281 Premenstrual dysphoric disorder: Secondary | ICD-10-CM | POA: Diagnosis not present

## 2019-04-11 DIAGNOSIS — M62830 Muscle spasm of back: Secondary | ICD-10-CM | POA: Diagnosis not present

## 2019-04-11 DIAGNOSIS — M6289 Other specified disorders of muscle: Secondary | ICD-10-CM | POA: Diagnosis not present

## 2019-04-11 DIAGNOSIS — M62838 Other muscle spasm: Secondary | ICD-10-CM | POA: Diagnosis not present

## 2019-04-11 DIAGNOSIS — M25651 Stiffness of right hip, not elsewhere classified: Secondary | ICD-10-CM | POA: Diagnosis not present

## 2019-04-12 DIAGNOSIS — F4310 Post-traumatic stress disorder, unspecified: Secondary | ICD-10-CM | POA: Diagnosis not present

## 2019-04-19 DIAGNOSIS — S81012A Laceration without foreign body, left knee, initial encounter: Secondary | ICD-10-CM | POA: Diagnosis not present

## 2019-04-21 DIAGNOSIS — S81012A Laceration without foreign body, left knee, initial encounter: Secondary | ICD-10-CM | POA: Diagnosis not present

## 2019-04-21 DIAGNOSIS — F4310 Post-traumatic stress disorder, unspecified: Secondary | ICD-10-CM | POA: Diagnosis not present

## 2019-04-25 DIAGNOSIS — M62838 Other muscle spasm: Secondary | ICD-10-CM | POA: Diagnosis not present

## 2019-04-25 DIAGNOSIS — M62830 Muscle spasm of back: Secondary | ICD-10-CM | POA: Diagnosis not present

## 2019-04-25 DIAGNOSIS — M25651 Stiffness of right hip, not elsewhere classified: Secondary | ICD-10-CM | POA: Diagnosis not present

## 2019-04-25 DIAGNOSIS — M6289 Other specified disorders of muscle: Secondary | ICD-10-CM | POA: Diagnosis not present

## 2019-04-28 DIAGNOSIS — F4310 Post-traumatic stress disorder, unspecified: Secondary | ICD-10-CM | POA: Diagnosis not present

## 2019-05-09 DIAGNOSIS — M25651 Stiffness of right hip, not elsewhere classified: Secondary | ICD-10-CM | POA: Diagnosis not present

## 2019-05-09 DIAGNOSIS — M62830 Muscle spasm of back: Secondary | ICD-10-CM | POA: Diagnosis not present

## 2019-05-09 DIAGNOSIS — M6289 Other specified disorders of muscle: Secondary | ICD-10-CM | POA: Diagnosis not present

## 2019-05-09 DIAGNOSIS — M62838 Other muscle spasm: Secondary | ICD-10-CM | POA: Diagnosis not present

## 2019-05-19 DIAGNOSIS — F4310 Post-traumatic stress disorder, unspecified: Secondary | ICD-10-CM | POA: Diagnosis not present

## 2019-05-26 DIAGNOSIS — F332 Major depressive disorder, recurrent severe without psychotic features: Secondary | ICD-10-CM | POA: Diagnosis not present

## 2019-05-31 DIAGNOSIS — F4310 Post-traumatic stress disorder, unspecified: Secondary | ICD-10-CM | POA: Diagnosis not present

## 2019-06-09 DIAGNOSIS — M2550 Pain in unspecified joint: Secondary | ICD-10-CM | POA: Diagnosis not present

## 2019-06-09 DIAGNOSIS — E559 Vitamin D deficiency, unspecified: Secondary | ICD-10-CM | POA: Diagnosis not present

## 2019-06-09 DIAGNOSIS — F4310 Post-traumatic stress disorder, unspecified: Secondary | ICD-10-CM | POA: Diagnosis not present

## 2019-06-09 DIAGNOSIS — R5383 Other fatigue: Secondary | ICD-10-CM | POA: Diagnosis not present

## 2019-06-10 DIAGNOSIS — M25651 Stiffness of right hip, not elsewhere classified: Secondary | ICD-10-CM | POA: Diagnosis not present

## 2019-06-10 DIAGNOSIS — M6289 Other specified disorders of muscle: Secondary | ICD-10-CM | POA: Diagnosis not present

## 2019-06-10 DIAGNOSIS — M62830 Muscle spasm of back: Secondary | ICD-10-CM | POA: Diagnosis not present

## 2019-06-10 DIAGNOSIS — M62838 Other muscle spasm: Secondary | ICD-10-CM | POA: Diagnosis not present

## 2019-06-13 DIAGNOSIS — M7989 Other specified soft tissue disorders: Secondary | ICD-10-CM | POA: Diagnosis not present

## 2019-06-13 DIAGNOSIS — M2550 Pain in unspecified joint: Secondary | ICD-10-CM | POA: Diagnosis not present

## 2019-06-16 DIAGNOSIS — F4310 Post-traumatic stress disorder, unspecified: Secondary | ICD-10-CM | POA: Diagnosis not present

## 2019-06-23 DIAGNOSIS — F4310 Post-traumatic stress disorder, unspecified: Secondary | ICD-10-CM | POA: Diagnosis not present

## 2019-06-24 DIAGNOSIS — M25651 Stiffness of right hip, not elsewhere classified: Secondary | ICD-10-CM | POA: Diagnosis not present

## 2019-06-24 DIAGNOSIS — M62830 Muscle spasm of back: Secondary | ICD-10-CM | POA: Diagnosis not present

## 2019-06-24 DIAGNOSIS — M62838 Other muscle spasm: Secondary | ICD-10-CM | POA: Diagnosis not present

## 2019-06-24 DIAGNOSIS — M6289 Other specified disorders of muscle: Secondary | ICD-10-CM | POA: Diagnosis not present

## 2019-06-29 ENCOUNTER — Ambulatory Visit: Payer: Managed Care, Other (non HMO) | Admitting: Allergy

## 2019-07-04 DIAGNOSIS — F4310 Post-traumatic stress disorder, unspecified: Secondary | ICD-10-CM | POA: Diagnosis not present

## 2019-07-14 DIAGNOSIS — F4310 Post-traumatic stress disorder, unspecified: Secondary | ICD-10-CM | POA: Diagnosis not present

## 2019-07-18 ENCOUNTER — Ambulatory Visit: Payer: Managed Care, Other (non HMO) | Admitting: Allergy

## 2019-08-08 ENCOUNTER — Ambulatory Visit: Payer: Self-pay | Admitting: Allergy

## 2019-08-08 NOTE — Progress Notes (Deleted)
New Patient Note  RE: Barbara Freeman MRN: [id number] DOB: August 31, 1991 Date of Office Visit: 08/08/2019  Referring provider: Lucila Maine Primary care provider: Teena Irani, PA-C  Chief Complaint: No chief complaint on file.  History of Present Illness: I had the pleasure of seeing Barbara Freeman for initial evaluation at the [facility] on 08/08/2019. She is a 28 y.o. female, who is referred here by Teena Irani, PA-C for the evaluation of food allergies. She is accompanied today by her *** who provided/contributed to the history.   Food: She reports food allergy to ***. The reaction occurred at the age of ***, after she ate *** amount of ***. Symptoms started within *** and was in the form of *** hives, swelling, wheezing, abdominal pain, diarrhea, vomiting. ***Denies any associated cofactors such as exertion, infection, NSAID use, or alcohol consumption. The symptoms lasted for ***. She was evaluated in ED and received ***. Since this episode, she does *** not report other accidental exposures to ***. She does *** not have access to epinephrine autoinjector and *** needed to use it.   Past work up includes: immunocap which showed *** and skin prick testing which showed ***.  Dietary History: patient has been eating other foods including ***milk, ***eggs, ***peanut, ***treenuts, ***sesame, ***shellfish, ***seafood, ***soy, ***wheat, ***meats, ***fruits and ***vegetables.  She reports reading labels and avoiding *** in diet completely. She tolerates ***baked egg and baked milk products.   Assessment and Plan: [name] is a 28 y.o. female with: No problem-specific Assessment & Plan notes found for this encounter.  No follow-ups on file.  No orders of the defined types were placed in this encounter.  Lab Orders  No laboratory test(s) ordered today    Other allergy screening: Asthma: {Blank single:19197::"yes","no"} Rhino conjunctivitis: {Blank  single:19197::"yes","no"} Food allergy: {Blank single:19197::"yes","no"} Medication allergy: {Blank single:19197::"yes","no"} Hymenoptera allergy: {Blank single:19197::"yes","no"} Urticaria: {Blank single:19197::"yes","no"} Eczema:{Blank single:19197::"yes","no"} History of recurrent infections suggestive of immunodeficency: {Blank single:19197::"yes","no"}  Diagnostics: Spirometry:  Tracings reviewed. Her effort: {Blank single:19197::"Good reproducible efforts.","It was hard to get consistent efforts and there is a question as to whether this reflects a maximal maneuver.","Poor effort, data can not be interpreted."} FVC: ***L FEV1: ***L, ***% predicted FEV1/FVC ratio: ***% Interpretation: {Blank single:19197::"Spirometry consistent with mild obstructive disease","Spirometry consistent with moderate obstructive disease","Spirometry consistent with severe obstructive disease","Spirometry consistent with possible restrictive disease","Spirometry consistent with mixed obstructive and restrictive disease","Spirometry uninterpretable due to technique","Spirometry consistent with normal pattern","No overt abnormalities noted given today's efforts"}.  Please see scanned spirometry results for details.  Skin Testing: {Blank single:19197::"Select foods","Environmental allergy panel","Environmental allergy panel and select foods","Food allergy panel","None","Deferred due to recent antihistamines use"}. Positive test to: ***. Negative test to: ***.  Results discussed with patient/family.   Past Medical History: There are no active problems to display for this patient.  Past Medical History:  Diagnosis Date  . Depression    Past Surgical History: No past surgical history on file. Medication List:  Current Outpatient Medications  Medication Sig Dispense Refill  . DULoxetine (CYMBALTA) 20 MG capsule Take 20 mg by mouth 2 (two) times daily.    Marland Kitchen escitalopram (LEXAPRO) 20 MG tablet Take 20 mg by  mouth daily.    Marland Kitchen HYDROcodone-homatropine (HYCODAN) 5-1.5 MG/5ML syrup Take 5 mLs by mouth every 6 (six) hours as needed for cough. (Patient not taking: Reported on 02/19/2017) 120 mL 0  . Multiple Vitamin (MULTIVITAMIN WITH MINERALS) TABS Take 1 tablet by mouth daily.    Marland Kitchen  OXcarbazepine (TRILEPTAL) 150 MG tablet Take 150 mg by mouth 2 (two) times daily.    . predniSONE (DELTASONE) 20 MG tablet Take 2 tablets (40 mg total) by mouth daily. (Patient not taking: Reported on 02/19/2017) 10 tablet 0  . QUEtiapine (SEROQUEL) 300 MG tablet Take 300 mg by mouth at bedtime.     No current facility-administered medications for this visit.    Allergies: No Known Allergies Social History: Social History   Socioeconomic History  . Marital status: Single    Spouse name: Not on file  . Number of children: Not on file  . Years of education: Not on file  . Highest education level: Not on file  Occupational History  . Not on file  Social Needs  . Financial resource strain: Not on file  . Food insecurity    Worry: Not on file    Inability: Not on file  . Transportation needs    Medical: Not on file    Non-medical: Not on file  Tobacco Use  . Smoking status: Never Smoker  . Smokeless tobacco: Never Used  Substance and Sexual Activity  . Alcohol use: Yes    Alcohol/week: 1.0 standard drinks    Types: 1 Glasses of wine per week  . Drug use: No  . Sexual activity: Not Currently    Partners: Male  Lifestyle  . Physical activity    Days per week: Not on file    Minutes per session: Not on file  . Stress: Not on file  Relationships  . Social Musicianconnections    Talks on phone: Not on file    Gets together: Not on file    Attends religious service: Not on file    Active member of club or organization: Not on file    Attends meetings of clubs or organizations: Not on file    Relationship status: Not on file  Other Topics Concern  . Not on file  Social History Narrative  . Not on file   Lives  in a ***. Smoking: *** Occupation: ***  Environmental HistorySurveyor, minerals: Water Damage/mildew in the house: Copywriter, advertising{Blank single:19197::"yes","no"} Carpet in the family room: {Blank single:19197::"yes","no"} Carpet in the bedroom: {Blank single:19197::"yes","no"} Heating: {Blank single:19197::"electric","gas"} Cooling: {Blank single:19197::"central","window"} Pet: {Blank single:19197::"yes ***","no"}  Family History: No family history on file. Problem                               Relation Asthma                                   *** Eczema                                *** Food allergy                          *** Allergic rhino conjunctivitis     ***  Review of Systems  Constitutional: Negative for appetite change, chills, fever and unexpected weight change.  HENT: Negative for congestion and rhinorrhea.   Eyes: Negative for itching.  Respiratory: Negative for cough, chest tightness, shortness of breath and wheezing.   Cardiovascular: Negative for chest pain.  Gastrointestinal: Negative for abdominal pain.  Genitourinary: Negative for difficulty urinating.  Skin: Negative for rash.  Allergic/Immunologic: Negative  for environmental allergies and food allergies.  Neurological: Negative for headaches.   Objective: There were no vitals taken for this visit. There is no height or weight on file to calculate BMI. Physical Exam  Constitutional: She is oriented to person, place, and time. She appears well-developed and well-nourished.  HENT:  Head: Normocephalic and atraumatic.  Right Ear: External ear normal.  Left Ear: External ear normal.  Nose: Nose normal.  Mouth/Throat: Oropharynx is clear and moist.  Eyes: Conjunctivae and EOM are normal.  Neck: Neck supple.  Cardiovascular: Normal rate, regular rhythm and normal heart sounds. Exam reveals no gallop and no friction rub.  No murmur heard. Pulmonary/Chest: Effort normal and breath sounds normal. She has no wheezes. She has no rales.   Abdominal: Soft.  Neurological: She is alert and oriented to person, place, and time.  Skin: Skin is warm. No rash noted.  Psychiatric: She has a normal mood and affect. Her behavior is normal.  Nursing note and vitals reviewed.  The plan was reviewed with the patient/family, and all questions/concerned were addressed.  It was my pleasure to see [place] today and participate in her care. Please feel free to contact me with any questions or concerns.  Sincerely,  Rexene Alberts, DO Allergy & Immunology  [facility] office: [phone] [facility] office: [place] office: [phone]

## 2020-01-30 ENCOUNTER — Encounter (HOSPITAL_COMMUNITY): Payer: Self-pay

## 2020-01-30 ENCOUNTER — Emergency Department (HOSPITAL_COMMUNITY)
Admission: EM | Admit: 2020-01-30 | Discharge: 2020-01-31 | Disposition: A | Discharge status: Home / Self Care | Payer: BC Managed Care – PPO | Attending: Emergency Medicine | Admitting: Emergency Medicine

## 2020-01-30 ENCOUNTER — Other Ambulatory Visit: Payer: Self-pay

## 2020-01-30 DIAGNOSIS — Y999 Unspecified external cause status: Secondary | ICD-10-CM | POA: Diagnosis not present

## 2020-01-30 DIAGNOSIS — Z79899 Other long term (current) drug therapy: Secondary | ICD-10-CM | POA: Insufficient documentation

## 2020-01-30 DIAGNOSIS — Y92007 Garden or yard of unspecified non-institutional (private) residence as the place of occurrence of the external cause: Secondary | ICD-10-CM | POA: Diagnosis not present

## 2020-01-30 DIAGNOSIS — Z23 Encounter for immunization: Secondary | ICD-10-CM | POA: Insufficient documentation

## 2020-01-30 DIAGNOSIS — S81812A Laceration without foreign body, left lower leg, initial encounter: Secondary | ICD-10-CM | POA: Insufficient documentation

## 2020-01-30 DIAGNOSIS — Y9339 Activity, other involving climbing, rappelling and jumping off: Secondary | ICD-10-CM | POA: Insufficient documentation

## 2020-01-30 DIAGNOSIS — W458XXA Other foreign body or object entering through skin, initial encounter: Secondary | ICD-10-CM | POA: Diagnosis not present

## 2020-01-30 MED ORDER — LIDOCAINE-EPINEPHRINE 2 %-1:100000 IJ SOLN
INTRAMUSCULAR | Status: AC
  Filled 2020-01-30: qty 1

## 2020-01-30 MED ORDER — LIDOCAINE-EPINEPHRINE 1 %-1:100000 IJ SOLN: 20.0000 mL | Freq: Once | INTRAMUSCULAR | Status: DC

## 2020-01-30 MED ORDER — LIDOCAINE-EPINEPHRINE (PF) 2 %-1:200000 IJ SOLN: 20.0000 mL | Freq: Once | INTRAMUSCULAR | Status: DC

## 2020-01-30 NOTE — ED Triage Notes (Signed)
Left leg laceration to left thigh after jumping over chain link fence. Very deep with underlying tissues showing.

## 2020-01-31 MED ORDER — BACITRACIN ZINC 500 UNIT/GM EX OINT
TOPICAL_OINTMENT | CUTANEOUS | Status: AC
  Filled 2020-01-31: qty 0.9

## 2020-01-31 MED ORDER — TETANUS-DIPHTH-ACELL PERTUSSIS 5-2.5-18.5 LF-MCG/0.5 IM SUSP
0.5000 mL | Freq: Once | INTRAMUSCULAR | Status: AC
  Administered 2020-01-31: 01:00:00 0.5 mL via INTRAMUSCULAR
  Filled 2020-01-31: qty 0.5

## 2020-01-31 MED ORDER — BACITRACIN ZINC 500 UNIT/GM EX OINT: TOPICAL_OINTMENT | Freq: Two times a day (BID) | CUTANEOUS | Status: DC

## 2020-01-31 NOTE — ED Notes (Signed)
Pt instructed to follow up for suture/ staple removal in 10-14 days.

## 2020-01-31 NOTE — ED Provider Notes (Signed)
Emergency Department Provider Note  I have reviewed the triage vital signs and the nursing notes.  HISTORY  Chief Complaint Leg Laceration   HPI Barbara Freeman is a 29 y.o. female without any significant past medical history who presents to the emergency department today secondary to a laceration.  Patient was jumping a fence in her backyard and got it caught on the chain link on the top and received a laceration to her left thigh.  No injuries elsewhere.  Did not fall or hit her head.  Last tetanus shot was approximately 8 years ago.  No known medical problems.   No other associated or modifying symptoms.    Past Medical History:  Diagnosis Date  . Depression     There are no problems to display for this patient.   History reviewed. No pertinent surgical history.  Current Outpatient Rx  . Order #: [id number] Class: Historical Med  . Order #: [id number] Class: Historical Med  . Order #: [id number] Class: Historical Med  . Order #: [id number] Class: Historical Med  . Order #: [id number] Class: Historical Med    Allergies Other  No family history on file.  Social History Social History   Tobacco Use  . Smoking status: Never Smoker  . Smokeless tobacco: Never Used  Substance Use Topics  . Alcohol use: Yes    Alcohol/week: 1.0 standard drinks    Types: 1 Glasses of wine per week  . Drug use: No    Review of Systems  All other systems negative except as documented in the HPI. All pertinent positives and negatives as reviewed in the HPI. ____________________________________________  PHYSICAL EXAM:  VITAL SIGNS: ED Triage Vitals  Enc Vitals Group     BP 01/30/20 2125 (!) 132/97     Pulse Rate 01/30/20 2125 (!) 116     Resp 01/30/20 2125 18     Temp 01/30/20 2125 98.3 F (36.8 C)     Temp Source 01/30/20 2125 Oral     SpO2 01/30/20 2125 97 %     Weight 01/30/20 2127 250 lb (113.4 kg)     Height 01/30/20 2127 5\' 6"  (1.676 m)    Constitutional: Alert and  oriented. Well appearing and in no acute distress. Eyes: Conjunctivae are normal. PERRL. EOMI. Head: Atraumatic. Nose: No congestion/rhinnorhea. Mouth/Throat: Mucous membranes are moist.  Oropharynx non-erythematous. Neck: No stridor.  No meningeal signs.   Cardiovascular: Normal rate, regular rhythm. Good peripheral circulation. Grossly normal heart sounds.   Respiratory: Normal respiratory effort.  No retractions. Lungs CTAB. Gastrointestinal: Soft and nontender. No distention.  Musculoskeletal: No lower extremity tenderness nor edema. No gross deformities of extremities. Neurologic:  Normal speech and language. No gross focal neurologic deficits are appreciated.  Skin:  Skin is warm, dry and intact. No rash noted. u-shaped laceration to left inner thigh with a middle flap that has some dusky skin on distal 0.5 cm. Hemostatic. Approximately 1.5 cm deep at deepest point, but mostly just in adipose tissue. Does not violate fascia or muscle. ____________________________________________   PROCEDURES  Procedure(s) performed:   Marland KitchenLaceration Repair  Date/Time: 01/31/2020 12:42 AM Performed by: 04/01/2020, MD Authorized by: Marily Memos, MD   Consent:    Consent obtained:  Verbal   Consent given by:  Patient   Risks discussed:  Infection, need for additional repair, nerve damage, poor wound healing, poor cosmetic result, pain, retained foreign body, tendon damage and vascular damage   Alternatives discussed:  No treatment, delayed treatment and observation Anesthesia (see MAR  for exact dosages):    Anesthesia method:  Local infiltration   Local anesthetic:  Lidocaine 1% WITH epi Laceration details:    Location:  Leg   Leg location:  L upper leg   Wound length (cm): u shaped, one leg was approximately 6 cm, other was 4cm, the bottom was approximately 2cm.   Depth (mm):  15 Repair type:    Repair type:  Intermediate Pre-procedure details:    Preparation:  Patient was prepped and  draped in usual sterile fashion and imaging obtained to evaluate for foreign bodies Exploration:    Wound exploration: wound explored through full range of motion and entire depth of wound probed and visualized     Wound extent: areolar tissue violated     Contaminated: no   Treatment:    Area cleansed with:  Saline   Amount of cleaning:  Extensive   Irrigation solution:  Sterile water   Irrigation volume:  500   Irrigation method:  Syringe Subcutaneous repair:    Suture size:  3-0   Wound subcutaneous closure material used: vicryl rapide.   Suture technique:  Simple interrupted   Number of sutures:  2 Skin repair:    Repair method:  Sutures and staples   Suture size:  3-0   Suture material:  Prolene   Suture technique:  Simple interrupted   Number of sutures:  6   Number of staples:  10 Approximation:    Approximation:  Close Post-procedure details:    Dressing:  Antibiotic ointment and non-adherent dressing   Patient tolerance of procedure:  Tolerated well, no immediate complications   ____________________________________________  INITIAL IMPRESSION / ASSESSMENT AND PLAN / ED COURSE   This patient presents to the ED for concern of a laceration to leg, this involves an extensive number of treatment options, and is a complaint that carries with it a high risk of complications and morbidity.  The differential diagnosis includes laceration,      Lab Tests:   None indicated  Medicines ordered:   I ordered medication lidocaine  For analgesia and tdap   Imaging Studies ordered:   I independently visualized and interpreted imaging none  Additional history obtained:   Additional history obtained from n/a  Previous records obtained and reviewed n/a  Consultations Obtained:   I consulted noone  and discussed lab and imaging findings  Reevaluation:  After the interventions stated above, I reevaluated the patient and found improved alignment. Discussed s/s  infection, reasons to return to ED.   A medical screening exam was performed and I feel the patient has had an appropriate workup for their chief complaint at this time and likelihood of emergent condition existing is low. They have been counseled on decision, discharge, follow up and which symptoms necessitate immediate return to the emergency department. They or their family verbally stated understanding and agreement with plan and discharged in stable condition.   ____________________________________________  FINAL CLINICAL IMPRESSION(S) / ED DIAGNOSES  Final diagnoses:  Laceration of left lower extremity, initial encounter    MEDICATIONS GIVEN DURING THIS VISIT:  Medications  lidocaine-EPINEPHrine (XYLOCAINE W/EPI) 2 %-1:200000 (PF) injection 20 mL (0 mLs Intradermal Hold 01/31/20 0035)  bacitracin 500 UNIT/GM ointment (has no administration in time range)  Tdap (BOOSTRIX) injection 0.5 mL (has no administration in time range)  bacitracin ointment (has no administration in time range)  lidocaine-EPINEPHrine (XYLOCAINE W/EPI) 2 %-1:100000 (with pres) injection (  Given by Other 01/31/20 0034)    NEW OUTPATIENT MEDICATIONS  STARTED DURING THIS VISIT:  Current Discharge Medication List      Note:  This note was prepared with assistance of Dragon voice recognition software. Occasional wrong-word or sound-a-like substitutions may have occurred due to the inherent limitations of voice recognition software.   Yizel Canby, Barbara Cower, MD 01/31/20 [phone]
